# Patient Record
Sex: Female | Born: 1960 | Race: White | Hispanic: No | Marital: Single | Smoking: Never smoker
Health system: Southern US, Community
[De-identification: ages and names within clinical notes are randomized; demographics above are authoritative.]

---

## 2014-08-24 ENCOUNTER — Emergency Department: Payer: Self-pay | Admitting: Emergency Medicine

## 2014-08-24 LAB — URINALYSIS, COMPLETE
Bilirubin,UR: NEGATIVE
Glucose,UR: NEGATIVE mg/dL (ref 0–75)
KETONE: NEGATIVE
Nitrite: POSITIVE
PH: 5 (ref 4.5–8.0)
Protein: 100
RBC,UR: 46 /HPF (ref 0–5)
Specific Gravity: 1.02 (ref 1.003–1.030)
Squamous Epithelial: 1
WBC UR: 670 /HPF (ref 0–5)

## 2014-08-24 LAB — PREGNANCY, URINE: Pregnancy Test, Urine: NEGATIVE m[IU]/mL

## 2020-01-21 ENCOUNTER — Telehealth: Payer: Self-pay

## 2020-01-21 NOTE — Telephone Encounter (Signed)
Gastroenterology Pre-Procedure Review  Request Date: LVM for pt to call back to schedule date Requesting Physician: (TBD)  PATIENT REVIEW QUESTIONS: The patient responded to the following health history questions as indicated:    1. Are you having any GI issues? no 2. Do you have a personal history of Polyps? no 3. Do you have a family history of Colon Cancer or Polyps? no 4. Diabetes Mellitus? no 5. Joint replacements in the past 12 months?no 6. Major health problems in the past 3 months?no 7. Any artificial heart valves, MVP, or defibrillator?no    MEDICATIONS & ALLERGIES:    Patient reports the following regarding taking any anticoagulation/antiplatelet therapy:   Plavix, Coumadin, Eliquis, Xarelto, Lovenox, Pradaxa, Brilinta, or Effient? no Aspirin? no  Patient confirms/reports the following medications:  No current outpatient medications on file.   No current facility-administered medications for this visit.    Patient confirms/reports the following allergies:  Not on File  No orders of the defined types were placed in this encounter.   AUTHORIZATION INFORMATION Primary Insurance: 1D#: Group #:  Secondary Insurance: 1D#: Group #:  SCHEDULE INFORMATION: Date: TBD Upon patient call back. Time: Location:(TBD)

## 2020-01-21 NOTE — Telephone Encounter (Signed)
Colonoscopy triage has been completed with patient.  LVM for her to call the office back to schedule her colonoscopy date.  Thanks,  [place], [place]

## 2020-01-23 ENCOUNTER — Encounter: Payer: Self-pay | Admitting: *Deleted

## 2021-07-01 ENCOUNTER — Encounter: Payer: Self-pay | Admitting: Urology

## 2021-07-01 ENCOUNTER — Ambulatory Visit
Admission: RE | Admit: 2021-07-01 | Discharge: 2021-07-01 | Disposition: A | Discharge status: Home / Self Care | Payer: BC Managed Care – PPO | Source: Ambulatory Visit | Attending: Urology | Admitting: Urology

## 2021-07-01 ENCOUNTER — Other Ambulatory Visit: Payer: Self-pay

## 2021-07-01 ENCOUNTER — Ambulatory Visit: Payer: BC Managed Care – PPO | Admitting: Urology

## 2021-07-01 VITALS — BP 142/68 | HR 91 | Ht 66.0 in | Wt 270.0 lb

## 2021-07-01 DIAGNOSIS — N201 Calculus of ureter: Secondary | ICD-10-CM | POA: Diagnosis not present

## 2021-07-01 DIAGNOSIS — N200 Calculus of kidney: Secondary | ICD-10-CM | POA: Diagnosis not present

## 2021-07-01 NOTE — Progress Notes (Signed)
   07/01/2021 12:32 PM   Kathleen Jefferson 16-Nov-1960 [id number]  Referring provider: No referring provider defined for this encounter.  Chief Complaint  Patient presents with   Nephrolithiasis     HPI: Kathleen Jefferson is a 60 y.o. female who presents for evaluation of a ureteral calculus.  Presented to [facility] ED 06/12/2021 with a 3-day history of intermittent right flank pain with radiation to the groin.  Intensity severe without identifiable precipitating, aggravating or alleviating factors + Nausea and vomiting + Gross hematuria CT abdomen pelvis with a 5 mm right distal ureteral calculus with mild right hydronephrosis Pain was controlled with parenteral analgesics and discharged on oxycodone and tamsulosin for trial of passage Resolution of her pain several days after the ED visit and has been asymptomatic at that time Not aware of passing a stone though she was not straining her urine No previous history of stone disease  PMH:  Graves disease   Chronic back pain  Chronic lumbar back pain with history of right lower extremity paralysis status post epidural steroid injections   Vitamin D deficiency   Anxiety   Depression   Surgical History:  Cesarean section  X 2   Tubal ligation   Cholecystectomy   Home Medications:  Allergies as of 07/01/2021   No Known Allergies      Medication List        Accurate as of July 01, 2021 11:59 PM. If you have any questions, ask your nurse or doctor.          atorvastatin 20 MG tablet Commonly known as: LIPITOR Take by mouth.   Cholecalciferol 50 MCG (2000 UT) Tabs Take 1 tablet by mouth daily.   levothyroxine 175 MCG tablet Commonly known as: SYNTHROID Take 175 mcg by mouth every morning.   losartan 100 MG tablet Commonly known as: COZAAR losartan 100 mg tablet  TAKE 1 TABLET BY MOUTH EVERY DAY   tamsulosin 0.4 MG Caps capsule Commonly known as: FLOMAX Take 0.4 mg by mouth daily.        Allergies: No Known  Allergies  Family History: History reviewed. No pertinent family history.  Social History:  reports that she has never smoked. She has never used smokeless tobacco. She reports that she does not drink alcohol. No history on file for drug use.   Physical Exam: BP (!) 142/68   Pulse 91   Ht 5\' 6"  (1.676 m)   Wt 270 lb (122.5 kg)   BMI 43.58 kg/m   Constitutional:  Alert and oriented, No acute distress. HEENT: [place] AT, moist mucus membranes.  Trachea midline, no masses. Cardiovascular: No clubbing, cyanosis, or edema. Respiratory: Normal respiratory effort, no increased work of breathing. Psychiatric: Normal mood and affect.   Pertinent Imaging:  CT images personally reviewed and interpreted on [facility]  Assessment & Plan:    1.  Right distal ureteral calculus Has been asymptomatic for several weeks KUB today; if stone not visualized on KUB recommend follow-up CT or renal ultrasound She is a first-time stone former and discussed she has a 50% chance of forming another stone within the next 5 years We discussed general stone prevention guidelines and she was provided literature She will be notified with her KUB results and further recommendations   [facility], MD  [facility] [address] [place], [place] [phone]

## 2021-07-04 ENCOUNTER — Other Ambulatory Visit: Payer: Self-pay | Admitting: Urology

## 2021-07-04 ENCOUNTER — Encounter: Payer: Self-pay | Admitting: Urology

## 2021-07-04 DIAGNOSIS — N201 Calculus of ureter: Secondary | ICD-10-CM

## 2021-07-05 ENCOUNTER — Telehealth: Payer: Self-pay | Admitting: Family Medicine

## 2021-07-05 NOTE — Telephone Encounter (Signed)
Patient notified and voiced understanding.

## 2021-07-05 NOTE — Telephone Encounter (Signed)
-----   Message from Riki Altes, MD sent at 07/04/2021  9:33 AM EST ----- Radiology report mentions no stone seen however I think her right ureteral calculus is still present.  Recommend follow-up CT to see if stone is still present.  Order was entered and will call with results

## 2021-07-15 ENCOUNTER — Other Ambulatory Visit: Payer: BC Managed Care – PPO

## 2022-12-01 ENCOUNTER — Other Ambulatory Visit: Payer: Self-pay

## 2022-12-23 IMAGING — CR DG ABDOMEN 1V
1 series · 2 of 2 positions shown · non-contrast
Comparison: None.

CLINICAL DATA: History of right renal stone

EXAM:
ABDOMEN - 1 VIEW

[Series 1: dg abd 1 view · 0.14mm/px · 2 of 2 slices shown]
[im 1/2]
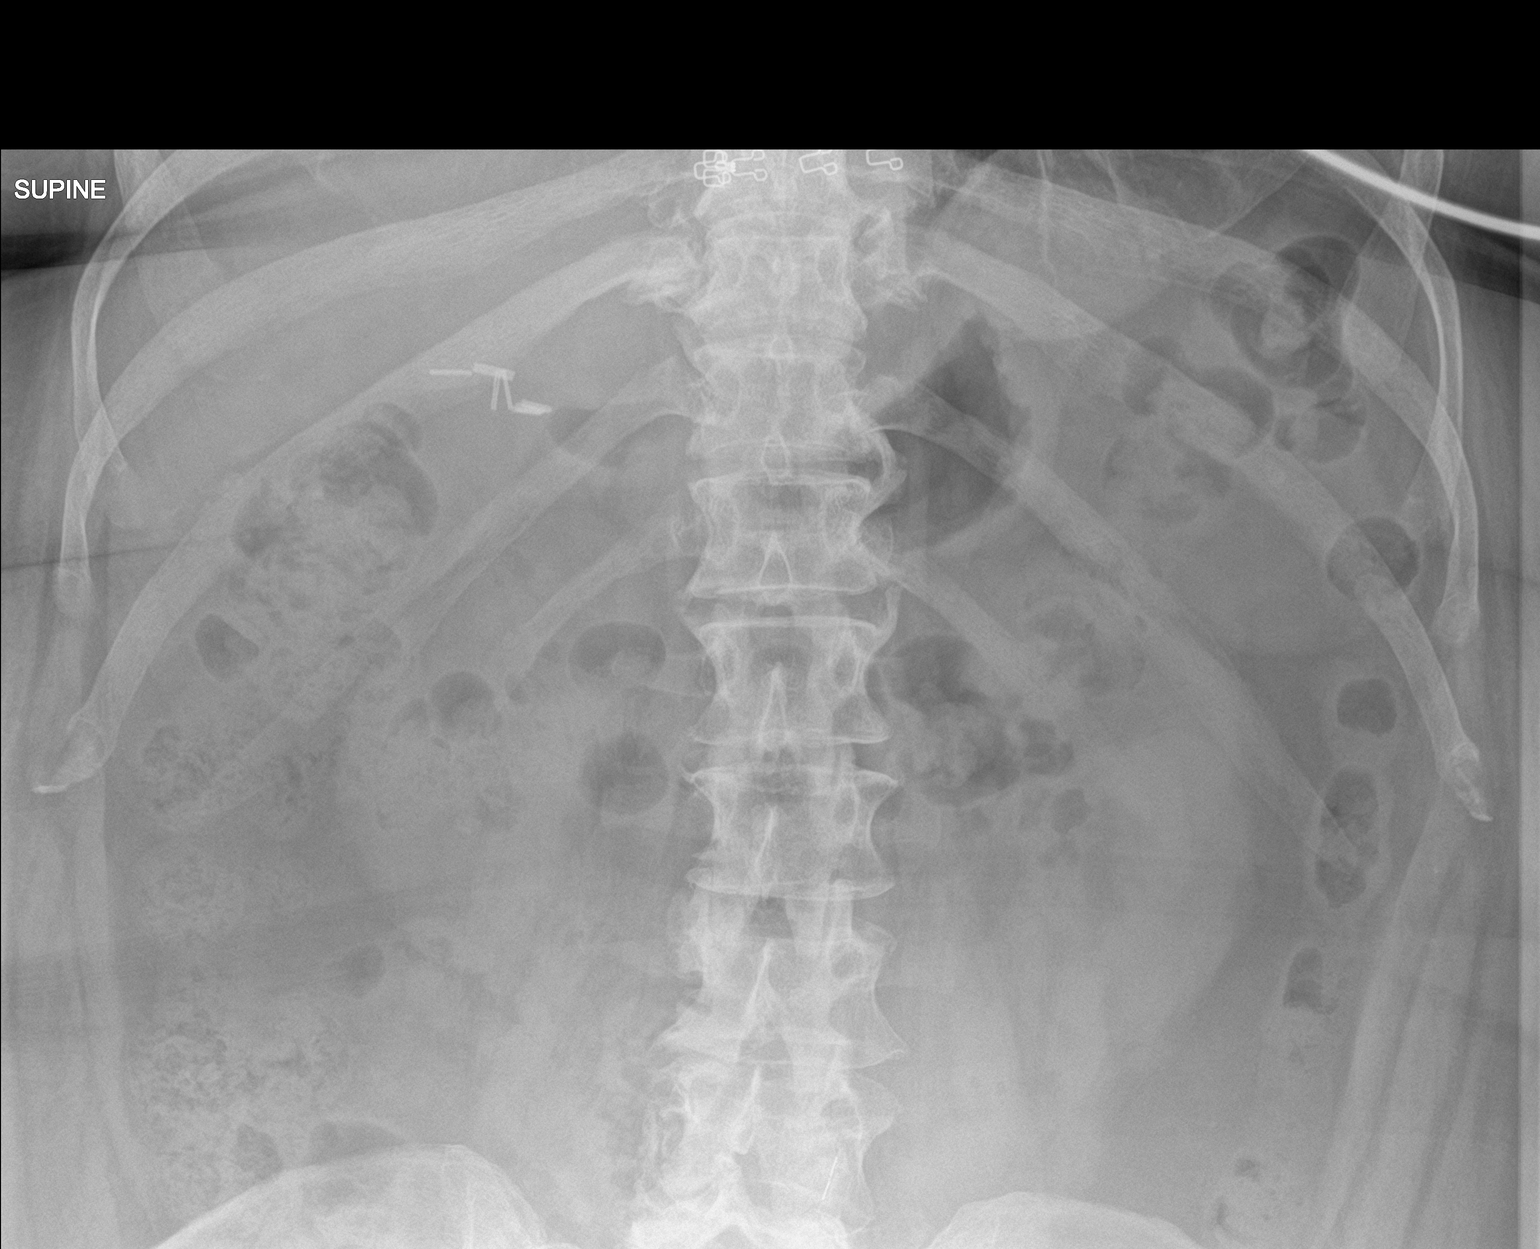
[im 2/2]
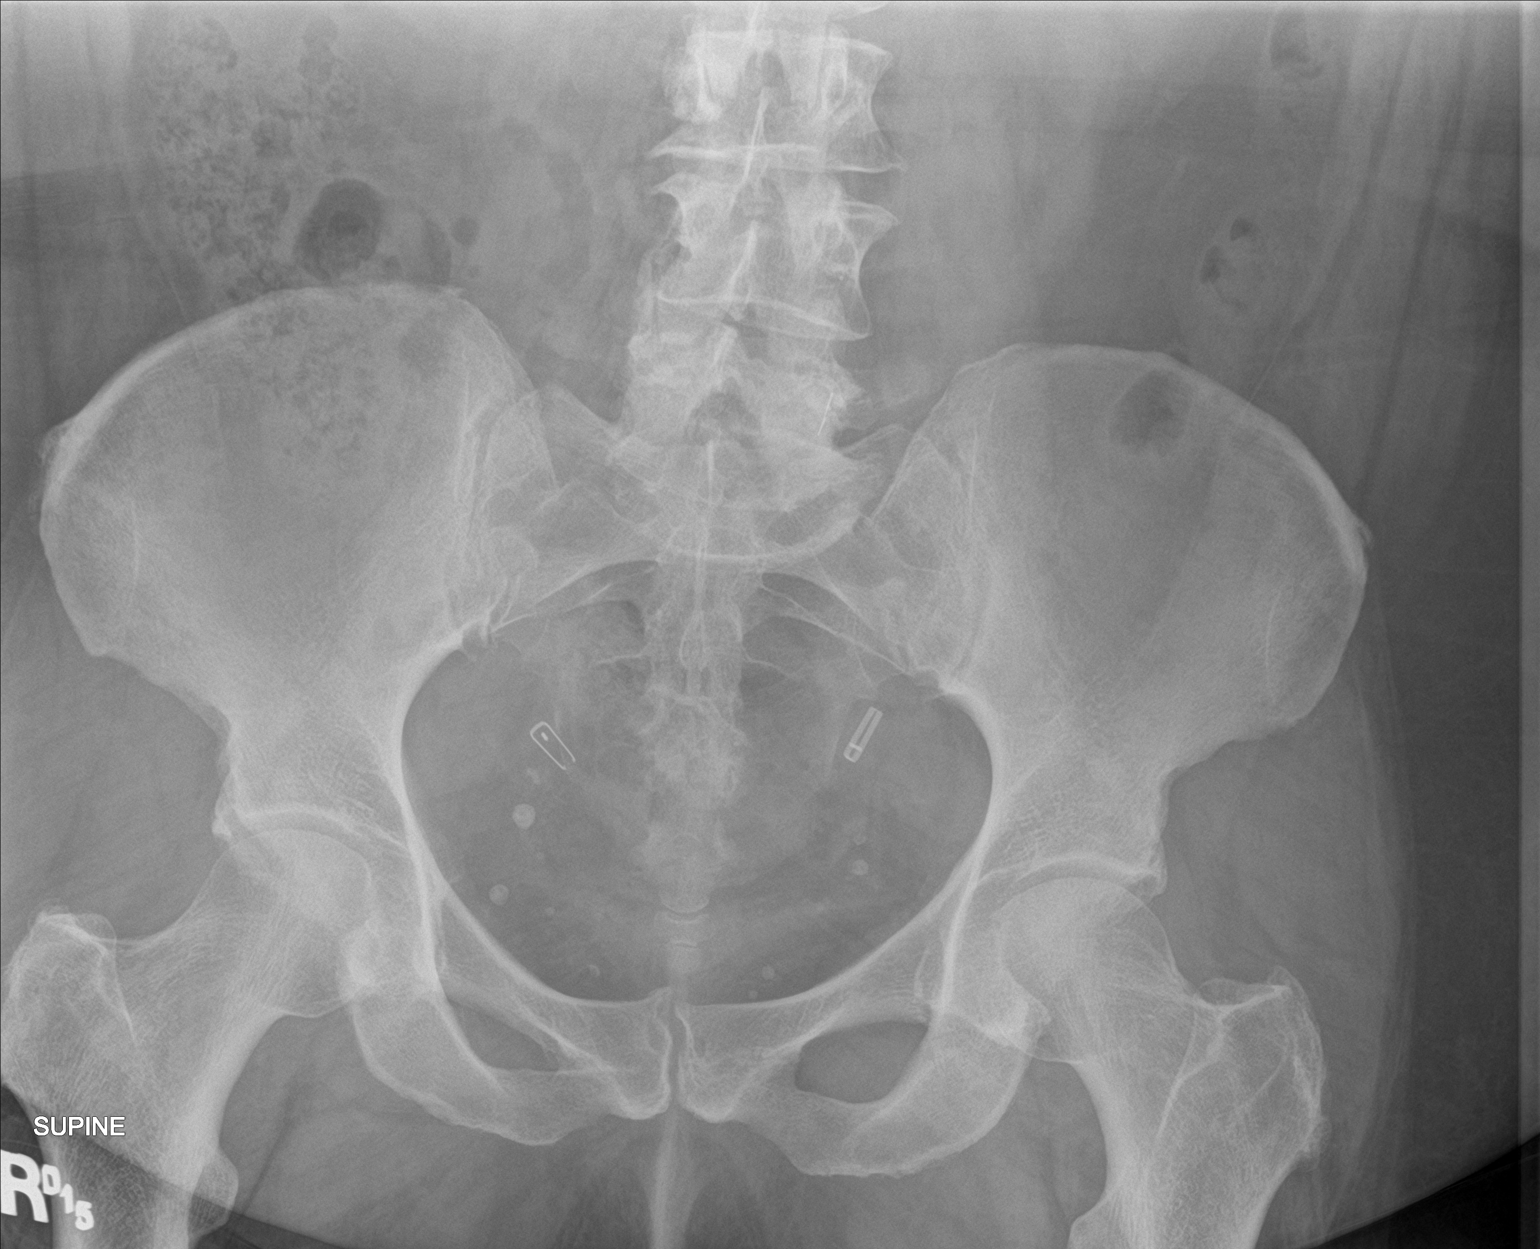

[2 of 2 positions shown; findings below may reference images not displayed]

FINDINGS: Scattered large and small bowel gas is noted. No renal calculi are
noted. No ureteral stones are seen. Degenerative change of the
lumbar spine is noted. Changes of prior tubal ligation and
cholecystectomy are seen.
IMPRESSION: No renal calculi noted.

## 2023-03-14 ENCOUNTER — Other Ambulatory Visit: Payer: Self-pay

## 2024-01-19 ENCOUNTER — Other Ambulatory Visit: Payer: Self-pay | Admitting: Nephrology

## 2024-01-19 DIAGNOSIS — E1129 Type 2 diabetes mellitus with other diabetic kidney complication: Secondary | ICD-10-CM

## 2024-01-19 DIAGNOSIS — R809 Proteinuria, unspecified: Secondary | ICD-10-CM

## 2024-01-19 DIAGNOSIS — R8290 Unspecified abnormal findings in urine: Secondary | ICD-10-CM

## 2024-02-12 ENCOUNTER — Ambulatory Visit: Payer: Self-pay
